# Patient Record
Sex: Male | Born: 1970 | Race: White | Hispanic: No
Health system: Southern US, Community
[De-identification: ages and names within clinical notes are randomized; demographics above are authoritative.]

---

## 2004-01-19 ENCOUNTER — Encounter: Admission: RE | Admit: 2004-01-19 | Discharge: 2004-01-19 | Payer: Self-pay | Admitting: Internal Medicine

## 2004-01-30 ENCOUNTER — Ambulatory Visit (HOSPITAL_COMMUNITY): Admission: RE | Admit: 2004-01-30 | Discharge: 2004-01-30 | Payer: Self-pay | Admitting: Orthopaedic Surgery

## 2004-10-16 ENCOUNTER — Ambulatory Visit (HOSPITAL_COMMUNITY): Admission: RE | Admit: 2004-10-16 | Discharge: 2004-10-16 | Payer: Self-pay | Admitting: Orthopedic Surgery

## 2006-10-27 IMAGING — CR DG ORBITS FOR FOREIGN BODY
2 series · 2 of 2 positions shown · non-contrast
Comparison: Clearing orbits 01/30/2004.

CLINICAL DATA: Metal working/exposure;  clearance prior to MRI

ORBITS FOR FOREIGN BODY - 2 VIEW  10/16/2004:

[view not recorded (1 of 2)]
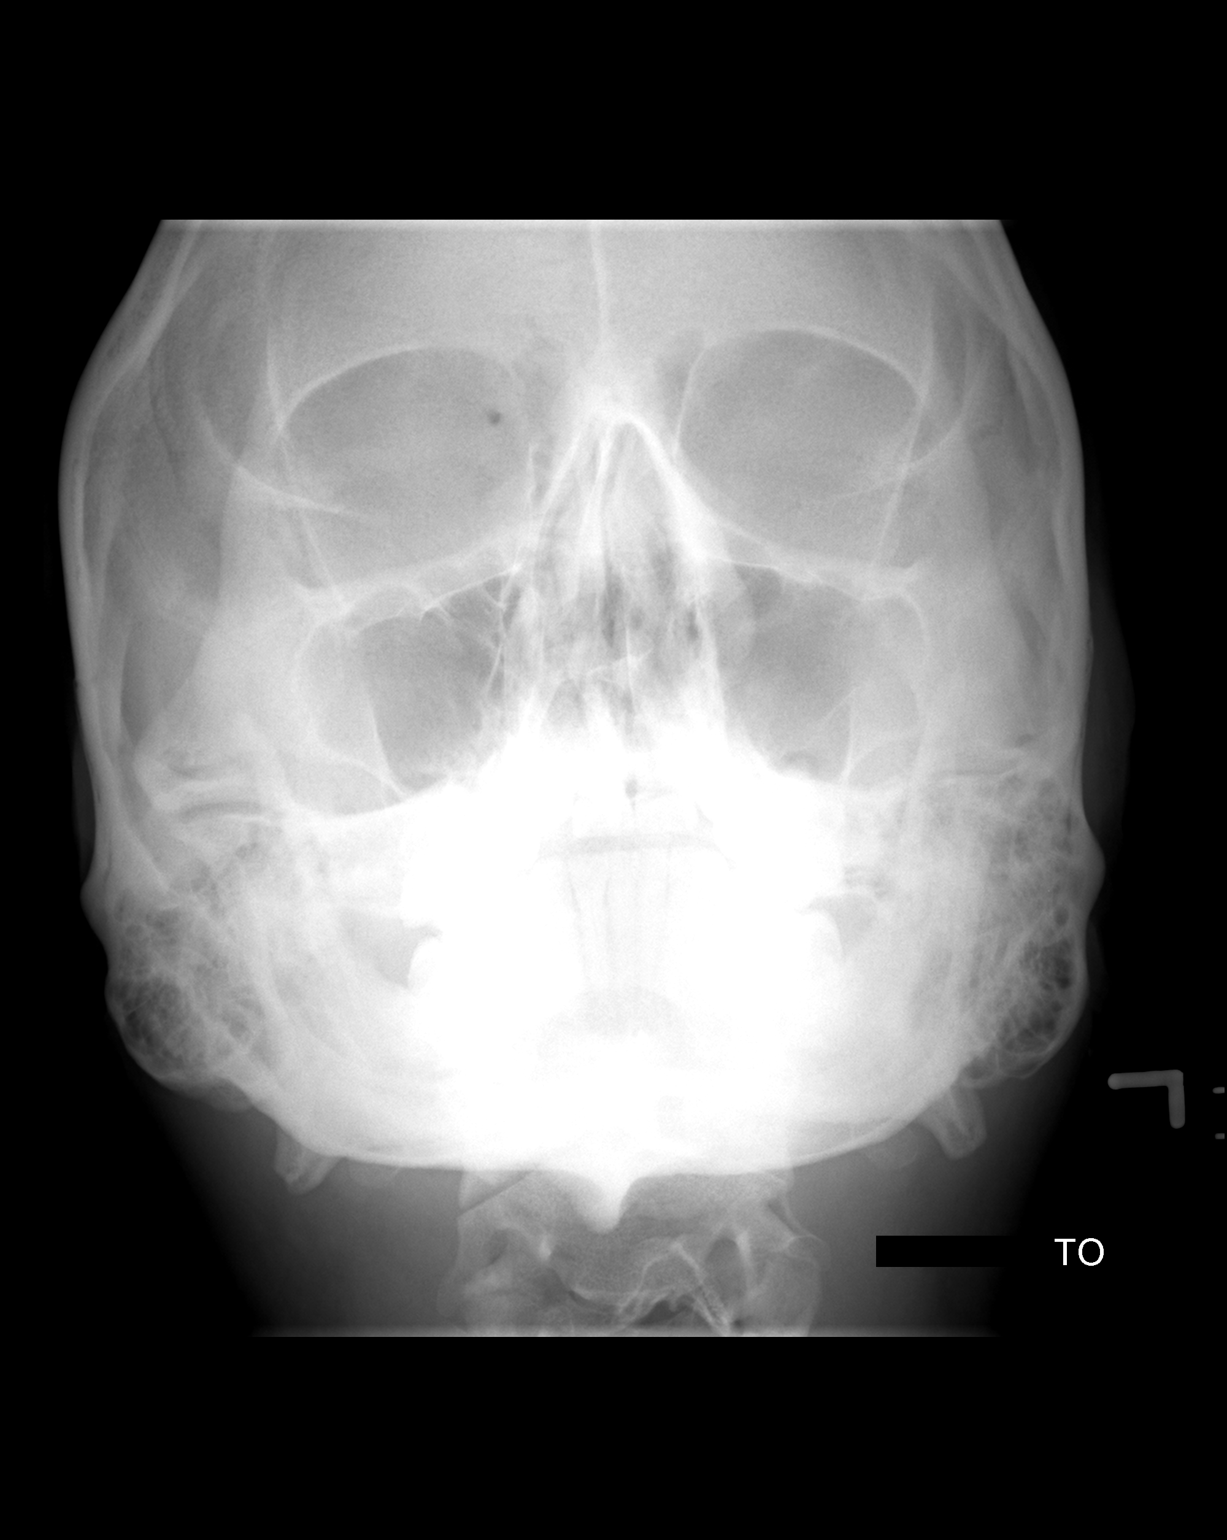

[view not recorded (2 of 2)]
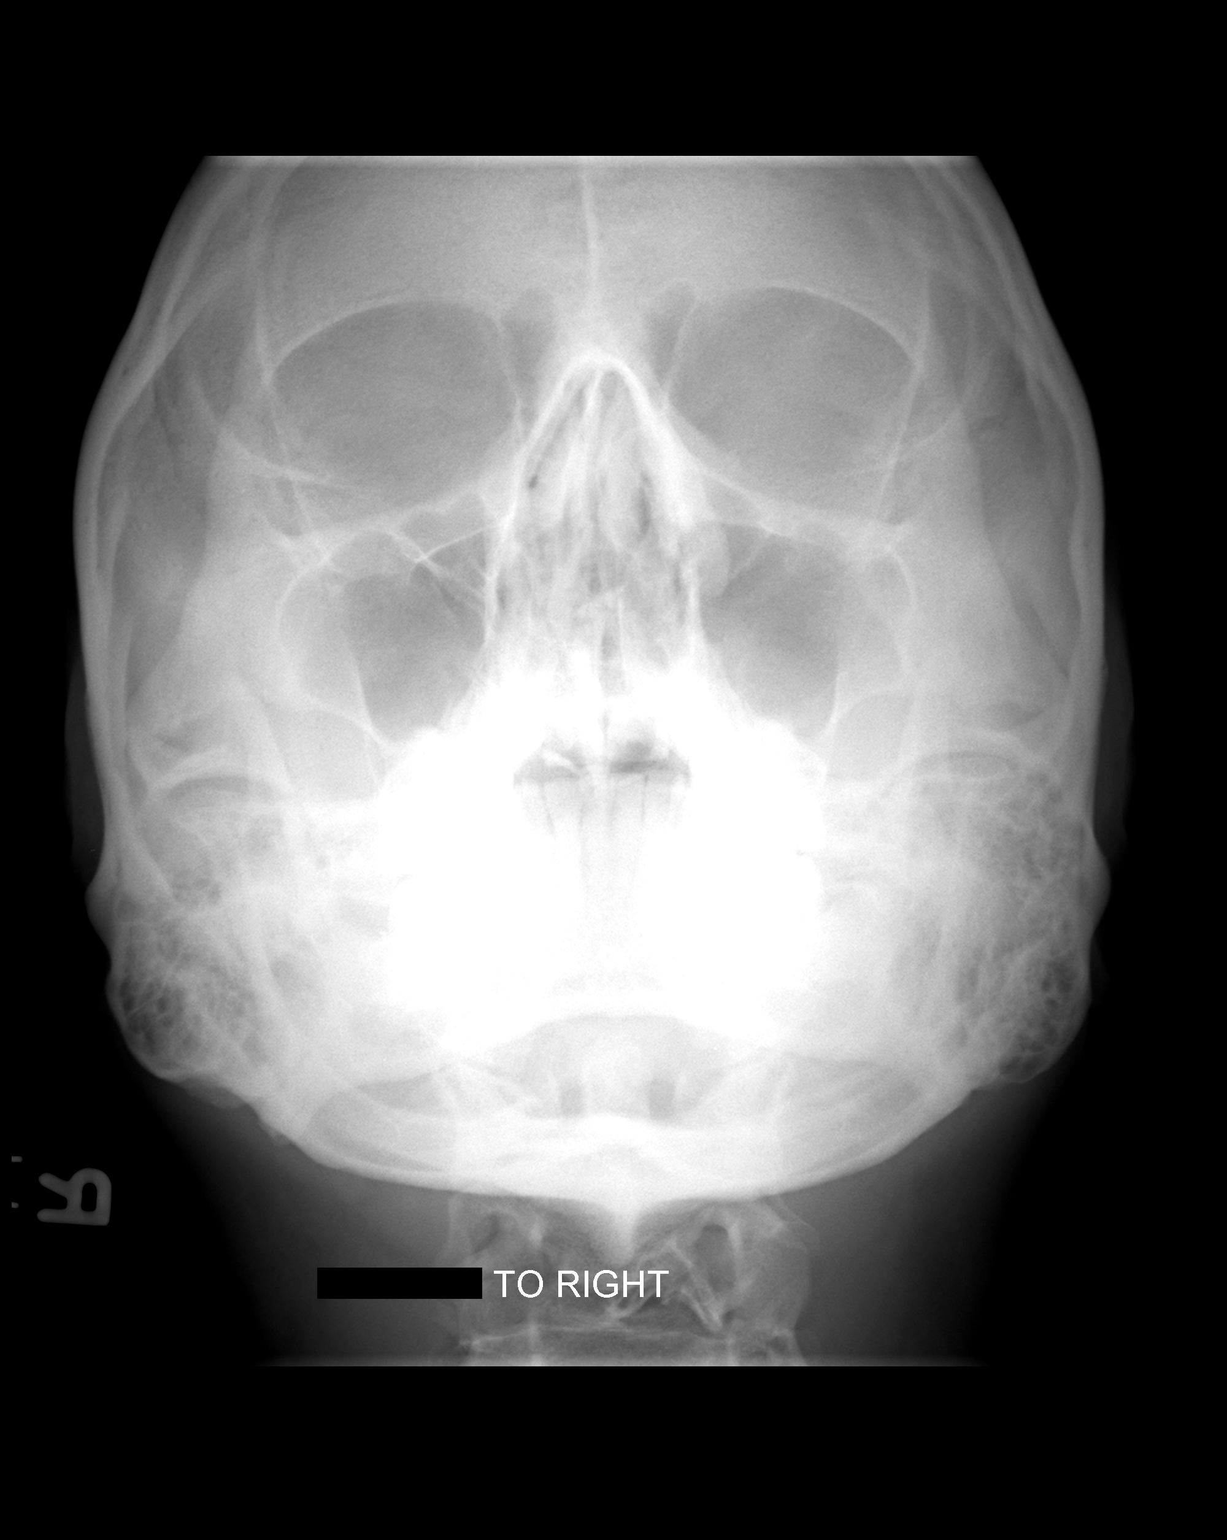

[2 of 2 positions shown; findings below may reference images not displayed]

FINDINGS: There is no evidence of metallic foreign body within the orbits.  No
significant osseous abnormality is identified. Visualized paranasal sinuses
appear well-aerated. Note is made of hypoplastic frontal sinuses.
IMPRESSION: No evidence of metallic foreign body within the orbits.

## 2007-06-02 ENCOUNTER — Ambulatory Visit: Payer: Self-pay | Admitting: Vascular Surgery

## 2007-06-02 ENCOUNTER — Ambulatory Visit: Admission: RE | Admit: 2007-06-02 | Discharge: 2007-06-02 | Payer: Self-pay | Admitting: Orthopedic Surgery

## 2007-06-02 ENCOUNTER — Encounter (INDEPENDENT_AMBULATORY_CARE_PROVIDER_SITE_OTHER): Payer: Self-pay | Admitting: Orthopedic Surgery

## 2007-10-29 ENCOUNTER — Ambulatory Visit (HOSPITAL_COMMUNITY): Admission: RE | Admit: 2007-10-29 | Discharge: 2007-10-29 | Payer: Self-pay | Admitting: Orthopedic Surgery

## 2009-08-12 ENCOUNTER — Emergency Department (HOSPITAL_COMMUNITY): Admission: EM | Admit: 2009-08-12 | Discharge: 2009-08-12 | Payer: Self-pay | Admitting: Family Medicine

## 2010-10-01 NOTE — Op Note (Signed)
NAMECHILTON, Dennis Morrison                ACCOUNT NO.:  [id number]   MEDICAL RECORD NO.:  [id number]          PATIENT TYPE:  AMB   LOCATION:  DAY                          FACILITY:  [facility]   PHYSICIAN:  Marlowe Kays, M.D.  DATE OF BIRTH:  [birth year]   DATE OF PROCEDURE:  10/29/2007  DATE OF DISCHARGE:                               OPERATIVE REPORT   PREOPERATIVE DIAGNOSIS:  Retained painful screw through intramedullary  rod, right tibia.   POSTOPERATIVE DIAGNOSIS:  Retained painful screw through intramedullary  rod, right tibia.   PREOPERATIVE DIAGNOSES:  Removal of proximal right tibial screw.   SURGEON:  Dr. Simonne Come.   ASSISTANT:  Nurse.   ANESTHESIA:  General.   PATHOLOGY AND JUSTIFICATION FOR PROCEDURE:  He had intramedullary  rodding elsewhere over a year ago.  The proximal screw is now protruding  through the soft tissue in the proximal medial tibial area.  The  fracture is essentially healed.  Plan is to remove the proximal screw  today, which will dynamize the fracture, but the major reason for  removal of screw is because of this protuberance medially causing pain.   DESCRIPTION OF PROCEDURE:  I had originally talked to the patient about  having this performed under general anesthesia and using pneumatic  tourniquet.  Just prior surgery, he desired not to be put to sleep,  which meant that I could not used the pneumatic tourniquet.  The  tourniquet was applied, however, but just inflated.  The right leg  prepped with DuraPrep from tourniquet to ankle and draped in a sterile  field.  A time-out performed.  I started out using half-percent Marcaine  with adrenaline.  I went through the old lateral incision, but was not  immediately able to locate the screw.  I then brought in the C-arm and  despite using it, we had difficulty locating the screw and problems with  exposure.  Accordingly, we ended up giving him a general anesthetic and  after elevation of the leg, I  inflated the tourniquet to 300 mmHg.  I  then extended the incision, which gave [place] better exposure and ultimately  with the C-arm we were able to locate the screw and then find the  appropriate screwdriver head to remove it.  Removal was accomplished  without complication.  I irrigated the wound well and closed two  incision sites made in the fascia with interrupted 2-0 Vicryl.  The  subcutaneous tissue was closed with the same.  The skin with interrupted  4-0 nylon mattress sutures.  Betadine Adaptic dry sterile dressings were  applied.  The tourniquet was released.  He tolerated the procedure well  and was taken to the recovery room in satisfactory condition with no  known complications.           ______________________________  Marlowe Kays, M.D.     JA/MEDQ  D:  10/29/2007  T:  10/29/2007  Job:  [id number]

## 2011-08-23 IMAGING — CR DG ANKLE COMPLETE 3+V*L*
3 series · 3 of 3 positions shown · non-contrast
Comparison: None.

CLINICAL DATA: Left ankle injury.  Pain and soft tissue swelling
laterally.

LEFT ANKLE COMPLETE - 3+ VIEW

[view not recorded (1 of 3)]
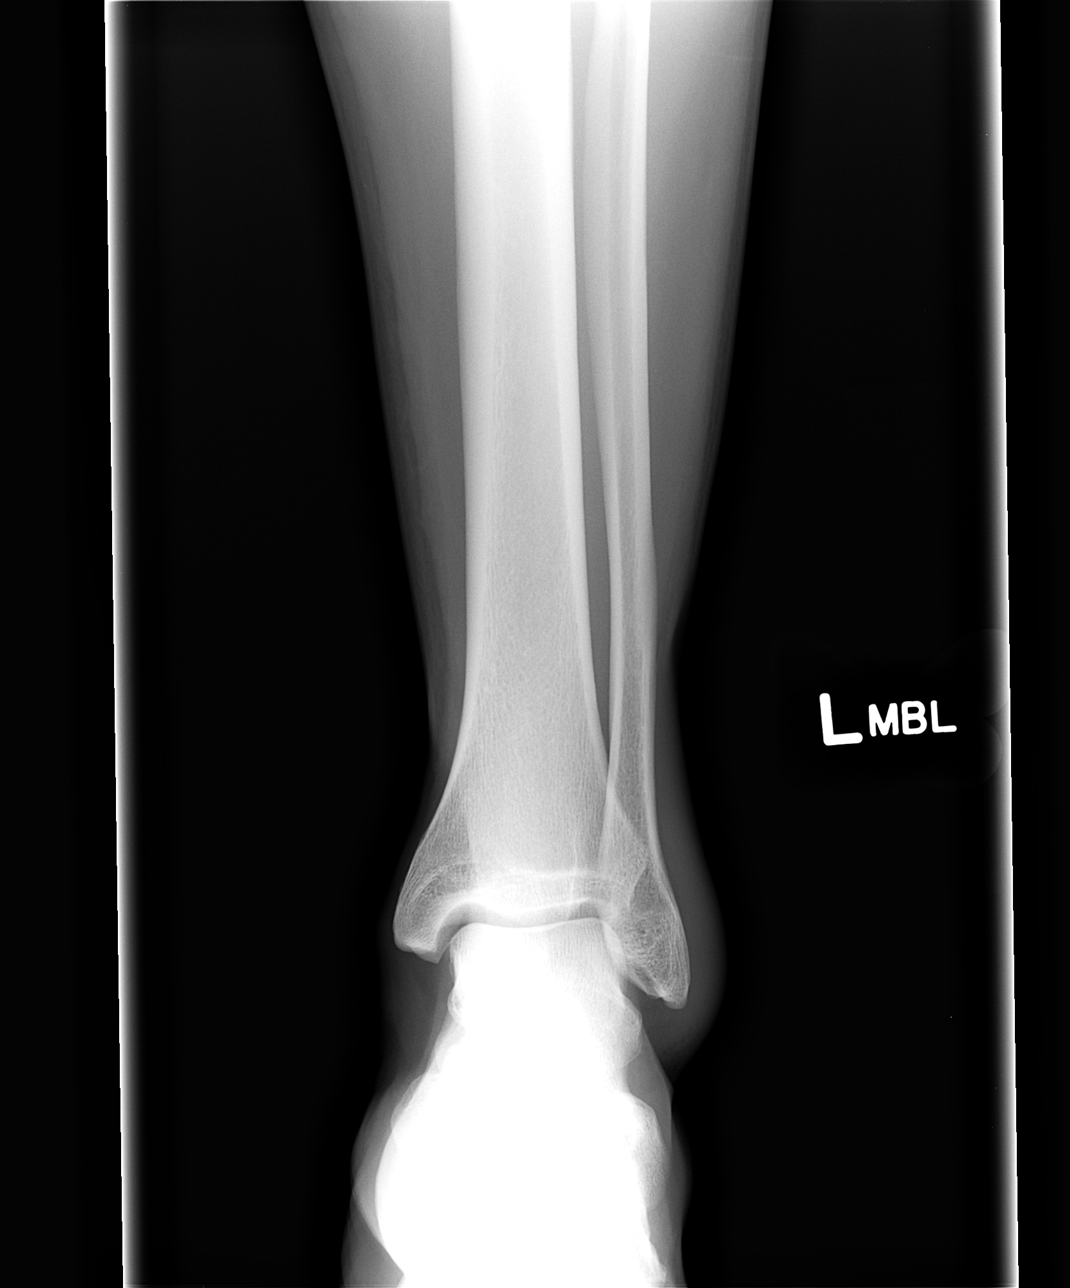

[view not recorded (2 of 3)]
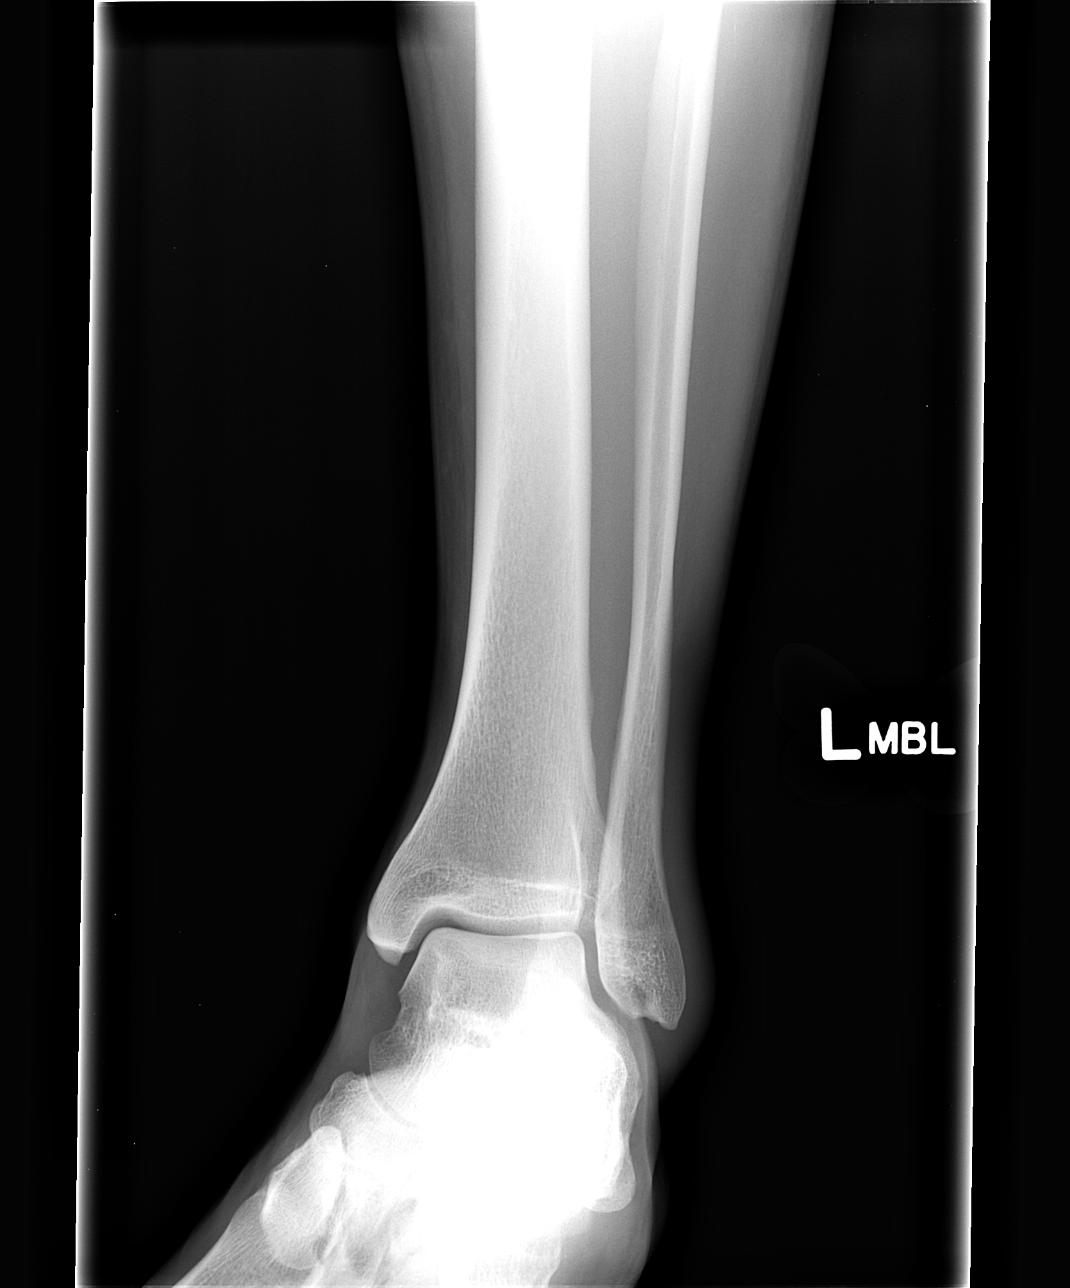

[view not recorded (3 of 3)]
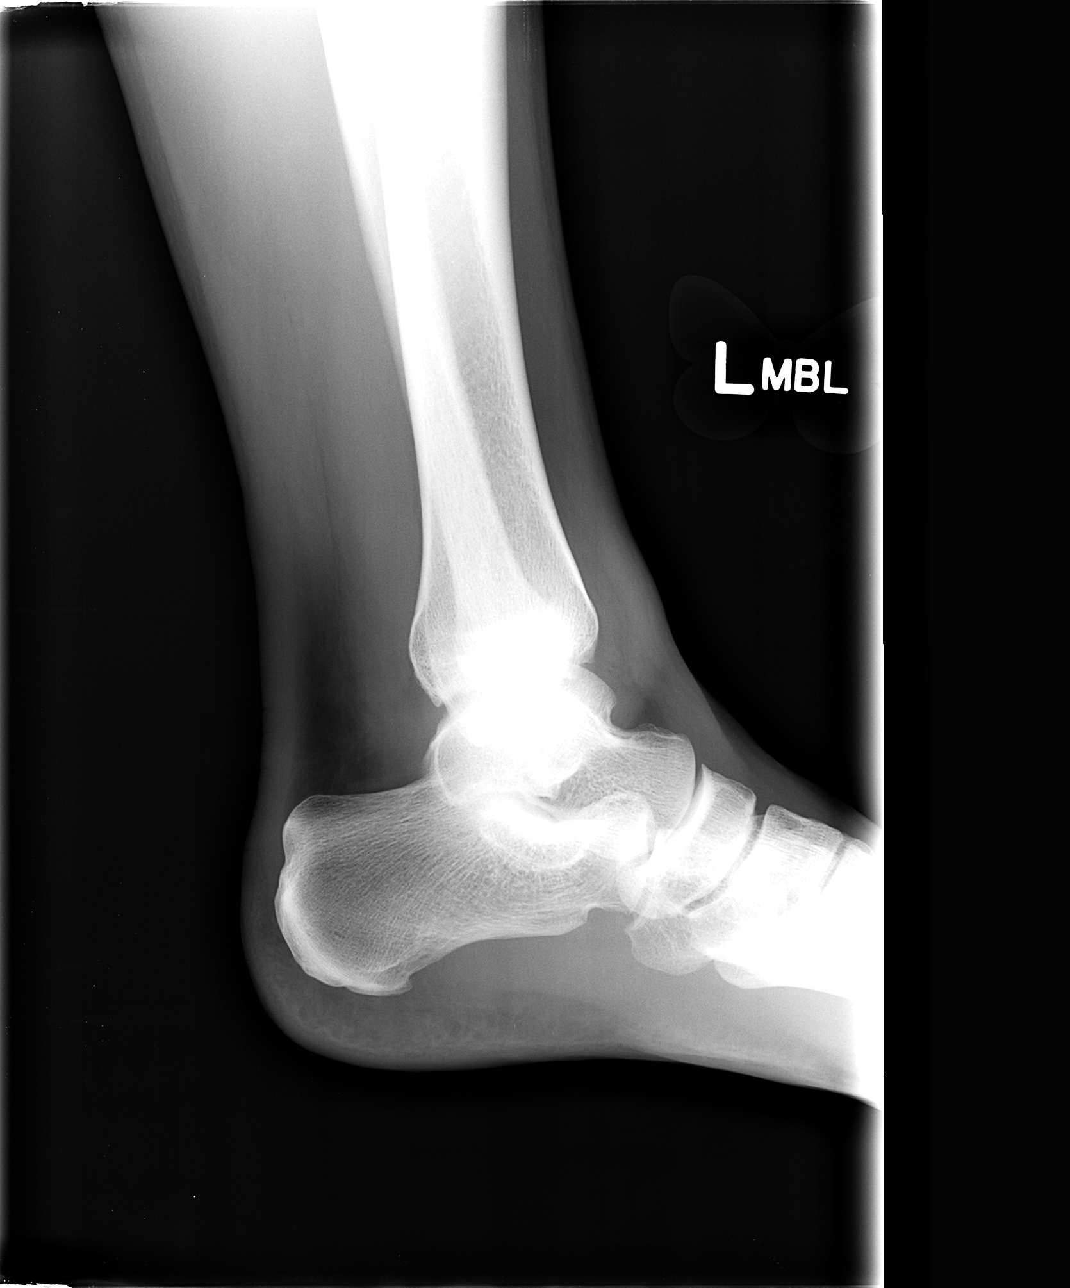

[3 of 3 positions shown; findings below may reference images not displayed]

FINDINGS: There is considerable soft tissue swelling over the
lateral malleolus without an underlying malleolar fracture
identified.

The plafond and talar dome appear unremarkable.  The medial
malleolus appears intact.

An anterior tibia talar joint effusion is present.
IMPRESSION: 1.  Soft tissue swelling overlying the lateral malleolus, with an
anterior tibiotalar joint effusion noted.  No fracture identified.

## 2020-03-03 ENCOUNTER — Other Ambulatory Visit: Payer: Self-pay

## 2020-03-03 ENCOUNTER — Ambulatory Visit: Payer: Self-pay | Attending: Internal Medicine

## 2020-03-03 DIAGNOSIS — Z23 Encounter for immunization: Secondary | ICD-10-CM

## 2021-05-07 ENCOUNTER — Encounter: Payer: Self-pay | Admitting: Plastic Surgery

## 2021-05-07 ENCOUNTER — Other Ambulatory Visit: Payer: Self-pay

## 2021-05-07 ENCOUNTER — Ambulatory Visit (INDEPENDENT_AMBULATORY_CARE_PROVIDER_SITE_OTHER): Payer: BC Managed Care – PPO | Admitting: Plastic Surgery

## 2021-05-07 DIAGNOSIS — L989 Disorder of the skin and subcutaneous tissue, unspecified: Secondary | ICD-10-CM

## 2021-05-07 DIAGNOSIS — L723 Sebaceous cyst: Secondary | ICD-10-CM | POA: Insufficient documentation

## 2021-05-07 NOTE — Progress Notes (Signed)
Patient ID: Dennis Morrison, male    DOB: [birth year], 50 y.o.   MRN: [id number]   Chief Complaint  Patient presents with   Skin Problem    The patient is a 50 year old gentleman here with his wife for evaluation of skin lesions.  The patient states that he has had a bump on the back of his neck for almost a year.  It seems to be getting thicker.  He denies any drainage.  He is concerned about the location and size.  It has the consistency of a sebaceous cyst and is 1 cm in size and firm.  Not very movable but not fixed to the underlying surface.  He also has a changing skin lesion on the back of his head.  This is raised and slightly pedunculated.  It is flesh-colored and a centimeter in size.  He also has an area of breakdown superior to this lesion.  Patient says he has had it for years.  He thinks it is from scratching.  He sees Dr. Terri Morrison and dermatology.  Nothing has been done for either 2 lesions.  [place]  He thinks he is has dandruff so he shaves his head to prevent having to deal with it but he does itch his head often.   Review of Systems  Constitutional: Negative.   HENT: Negative.    Eyes: Negative.   Respiratory: Negative.    Cardiovascular: Negative.   Gastrointestinal: Negative.   Endocrine: Negative.   Genitourinary: Negative.   Musculoskeletal: Negative.   Skin:  Positive for color change.  Psychiatric/Behavioral: Negative.     History reviewed. No pertinent past medical history.  History reviewed. No pertinent surgical history.   No current outpatient medications on file.   Objective:   Vitals:   05/07/21 0829  BP: 129/80  Pulse: (!) 59  SpO2: 98%    Physical Exam Vitals and nursing note reviewed.  Constitutional:      Appearance: Normal appearance.  HENT:     Head: Normocephalic.  Cardiovascular:     Rate and Rhythm: Normal rate.     Pulses: Normal pulses.  Pulmonary:     Effort: Pulmonary effort is normal. No respiratory  distress.  Abdominal:     General: There is no distension.     Palpations: Abdomen is soft.     Tenderness: There is no abdominal tenderness.  Musculoskeletal:        General: No swelling or deformity.  Skin:    General: Skin is warm.     Capillary Refill: Capillary refill takes less than 2 seconds.     Coloration: Skin is not jaundiced.     Findings: Lesion present. No bruising.  Neurological:     Mental Status: He is alert and oriented to person, place, and time.  Psychiatric:        Mood and Affect: Mood normal.        Behavior: Behavior normal.        Thought Content: Thought content normal.    Assessment & Plan:  Sebaceous cyst  Changing skin lesion  Recommend excision of sebaceous cyst on back of the neck.  Excision of changing skin lesion on back of the head.  I would like the patient to try [facility] for his shampoo.  If the skin breakdown does not improve then a biopsy is recommended.  Pictures were obtained of the patient and placed in the chart with the patient's or  guardian's permission.   Dennis Bills Robt Okuda, DO

## 2021-07-23 ENCOUNTER — Ambulatory Visit (INDEPENDENT_AMBULATORY_CARE_PROVIDER_SITE_OTHER): Payer: BC Managed Care – PPO | Admitting: Plastic Surgery

## 2021-07-23 ENCOUNTER — Telehealth: Payer: Self-pay

## 2021-07-23 ENCOUNTER — Other Ambulatory Visit (HOSPITAL_COMMUNITY)
Admission: RE | Admit: 2021-07-23 | Discharge: 2021-07-23 | Disposition: A | Discharge status: Home / Self Care | Payer: BC Managed Care – PPO | Source: Ambulatory Visit | Attending: Plastic Surgery | Admitting: Plastic Surgery

## 2021-07-23 ENCOUNTER — Encounter: Payer: Self-pay | Admitting: Plastic Surgery

## 2021-07-23 VITALS — BP 128/84 | HR 60

## 2021-07-23 DIAGNOSIS — L989 Disorder of the skin and subcutaneous tissue, unspecified: Secondary | ICD-10-CM | POA: Diagnosis not present

## 2021-07-23 DIAGNOSIS — L723 Sebaceous cyst: Secondary | ICD-10-CM | POA: Insufficient documentation

## 2021-07-23 NOTE — Progress Notes (Signed)
Procedure Note ? ?Preoperative Dx: skin lesion scalp, mass neck ? ?Postoperative Dx: Same ? ?Procedure:  ?Excision of skin lesion scalp 6 mm ?Excision of mass neck likely sebaceous cyst 1 cm ? ?Anesthesia: Lidocaine 1% with 1:100,000 epinephrine ? ?Description of Procedure: ?Risks and complications were explained to the patient.  Consent was confirmed and the patient understands the risks and benefits.  The potential complications and alternatives were explained and the patient consents.  The patient expressed understanding the option of not having the procedure and the risks of a scar.  Time out was called and all information was confirmed to be correct.   ? ?Scalp:  The area was prepped and drapped.  Lidocaine 1% with epinepherine was injected in the subcutaneous area.  After waiting several minutes for the local to take affect a #15 blade was used to excise the area in an eliptical pattern.  The skin edges were reapproximated with 5-0 Monocryl.  A dressing was applied.   ? ?Neck:   The area was prepped and drapped.  Lidocaine 1% with epinepherine was injected in the subcutaneous area.  After waiting several minutes for the local to take affect a #15 blade was used to incise the skin over the lesion.  It appeared as a sebaceous cyst.  The entire 1 cm lesion was excised.  The skin edges were reapproximated with 5-0 Monocryl.  A dressing was applied.  The patient was given instructions on how to care for the area and a follow up appointment.  Caige tolerated the procedure well and there were no complications.The specimen was sent to pathology. ? ?

## 2021-07-23 NOTE — Addendum Note (Signed)
Addended by: Peggye Form on: 07/23/2021 02:15 PM ? ? Modules accepted: Orders ? ?

## 2021-07-23 NOTE — Telephone Encounter (Signed)
Dennis Morrison called and lvm asking if there is an OTC medication that he can take. The local anesthetic wore off and now has a bit of a headache.   ?

## 2021-07-24 NOTE — Telephone Encounter (Signed)
Called pt, na, left vm; per [facility] yesterday, pt can take ibuprofen or Tylenol to help with HA.  ?

## 2021-07-25 LAB — SURGICAL PATHOLOGY

## 2021-07-30 NOTE — Progress Notes (Signed)
Patient is a 51 year old male with PMH of sebaceous cyst on back of neck as well as changing skin lesion both of which s/p excision performed 07/23/2021 by Dr. Ulice Bold who presents to clinic for postprocedural follow-up. ? ?I reviewed the procedure note and skin edges for both the cyst and scalp lesion were each reapproximated with 5-0 Monocryl.  Pathology sent for the cyst is reviewed and consistent with benign epidermal inclusion cyst. ? ?Today, patient is doing well.  He states that occasionally the sutures are bothersome.  He also reports there was some mild redness/irritation at the excision site for cyst that he has been watching with his wife.   ? ?Physical exam is entirely reassuring.  Mild erythema at excision site of neck cyst, but suspect irritation.  Nontender.  Low suspicion for infection.  Sutures well visualized and removed without complication or difficulty.  Scalp lesion excision site also appears to be healing well.  Hair has grown, difficult to visualize the 5-0 absorbable Monocryl sutures.  ? ?Did not remove the scalp lesion sutures given that they were unable to be visualized.  They may have already absorbed.  Provided reassurance that they are absorbable sutures and do not necessarily need to be removed. ? ?No specific follow-up needed.  Recommending Vaseline to the excision sites daily, particularly the one on the neck given evidence of irritation.  Suspect that it will continue to improve.  Encouraged him to avoid Neosporin.  May return to clinic should he have any questions or concerns. Picture(s) obtained of the patient and placed in the chart were with the patient's or guardian's permission. ? ? ?

## 2021-08-01 ENCOUNTER — Ambulatory Visit (INDEPENDENT_AMBULATORY_CARE_PROVIDER_SITE_OTHER): Payer: BC Managed Care – PPO | Admitting: Physician Assistant

## 2021-08-01 ENCOUNTER — Other Ambulatory Visit: Payer: Self-pay

## 2021-08-01 DIAGNOSIS — L989 Disorder of the skin and subcutaneous tissue, unspecified: Secondary | ICD-10-CM

## 2021-08-02 ENCOUNTER — Ambulatory Visit: Payer: BC Managed Care – PPO | Admitting: Surgical

## 2021-08-06 ENCOUNTER — Ambulatory Visit: Payer: BC Managed Care – PPO | Admitting: Plastic Surgery
